# Patient Record
Sex: Female | Born: 1980 | Race: Black or African American | Hispanic: No | Marital: Single | Smoking: Never smoker
Health system: Southern US, Community
[De-identification: ages and names within clinical notes are randomized; demographics above are authoritative.]

## PROBLEM LIST (undated history)

## (undated) DIAGNOSIS — L309 Dermatitis, unspecified: Secondary | ICD-10-CM

## (undated) DIAGNOSIS — R413 Other amnesia: Secondary | ICD-10-CM

## (undated) DIAGNOSIS — K589 Irritable bowel syndrome without diarrhea: Secondary | ICD-10-CM

## (undated) DIAGNOSIS — R5382 Chronic fatigue, unspecified: Secondary | ICD-10-CM

## (undated) DIAGNOSIS — N6029 Fibroadenosis of unspecified breast: Secondary | ICD-10-CM

## (undated) DIAGNOSIS — Z8639 Personal history of other endocrine, nutritional and metabolic disease: Secondary | ICD-10-CM

## (undated) DIAGNOSIS — R251 Tremor, unspecified: Secondary | ICD-10-CM

## (undated) DIAGNOSIS — M2550 Pain in unspecified joint: Secondary | ICD-10-CM

## (undated) DIAGNOSIS — I8390 Asymptomatic varicose veins of unspecified lower extremity: Secondary | ICD-10-CM

## (undated) DIAGNOSIS — F419 Anxiety disorder, unspecified: Secondary | ICD-10-CM

## (undated) DIAGNOSIS — R200 Anesthesia of skin: Secondary | ICD-10-CM

## (undated) DIAGNOSIS — R42 Dizziness and giddiness: Secondary | ICD-10-CM

## (undated) DIAGNOSIS — F32A Depression, unspecified: Secondary | ICD-10-CM

## (undated) DIAGNOSIS — R202 Paresthesia of skin: Secondary | ICD-10-CM

## (undated) DIAGNOSIS — R55 Syncope and collapse: Secondary | ICD-10-CM

## (undated) DIAGNOSIS — F329 Major depressive disorder, single episode, unspecified: Secondary | ICD-10-CM

## (undated) HISTORY — DX: Syncope and collapse: R55

## (undated) HISTORY — DX: Anxiety disorder, unspecified: F41.9

## (undated) HISTORY — DX: Fibroadenosis of unspecified breast: N60.29

## (undated) HISTORY — DX: Dizziness and giddiness: R42

## (undated) HISTORY — DX: Depression, unspecified: F32.A

## (undated) HISTORY — DX: Pain in unspecified joint: M25.50

## (undated) HISTORY — DX: Chronic fatigue, unspecified: R53.82

## (undated) HISTORY — DX: Paresthesia of skin: R20.2

## (undated) HISTORY — DX: Asymptomatic varicose veins of unspecified lower extremity: I83.90

## (undated) HISTORY — DX: Personal history of other endocrine, nutritional and metabolic disease: Z86.39

## (undated) HISTORY — DX: Tremor, unspecified: R25.1

## (undated) HISTORY — DX: Irritable bowel syndrome, unspecified: K58.9

## (undated) HISTORY — DX: Dermatitis, unspecified: L30.9

## (undated) HISTORY — DX: Major depressive disorder, single episode, unspecified: F32.9

## (undated) HISTORY — DX: Anesthesia of skin: R20.0

## (undated) HISTORY — DX: Other amnesia: R41.3

## (undated) HISTORY — PX: TUBAL LIGATION: SHX77

---

## 2008-07-18 ENCOUNTER — Emergency Department (HOSPITAL_COMMUNITY): Admission: EM | Admit: 2008-07-18 | Discharge: 2008-07-18 | Payer: Self-pay | Admitting: Emergency Medicine

## 2009-02-04 ENCOUNTER — Emergency Department (HOSPITAL_COMMUNITY): Admission: EM | Admit: 2009-02-04 | Discharge: 2009-02-04 | Payer: Self-pay | Admitting: Emergency Medicine

## 2009-03-01 ENCOUNTER — Emergency Department (HOSPITAL_COMMUNITY): Admission: EM | Admit: 2009-03-01 | Discharge: 2009-03-01 | Payer: Self-pay | Admitting: Obstetrics and Gynecology

## 2009-04-01 ENCOUNTER — Emergency Department (HOSPITAL_COMMUNITY): Admission: EM | Admit: 2009-04-01 | Discharge: 2009-04-01 | Payer: Self-pay | Admitting: Emergency Medicine

## 2009-04-10 ENCOUNTER — Other Ambulatory Visit: Admission: RE | Admit: 2009-04-10 | Discharge: 2009-04-10 | Payer: Self-pay | Admitting: Obstetrics and Gynecology

## 2009-08-27 ENCOUNTER — Emergency Department (HOSPITAL_COMMUNITY): Admission: EM | Admit: 2009-08-27 | Discharge: 2009-08-27 | Payer: Self-pay | Admitting: Emergency Medicine

## 2009-09-10 ENCOUNTER — Emergency Department (HOSPITAL_COMMUNITY): Admission: EM | Admit: 2009-09-10 | Discharge: 2009-09-10 | Payer: Self-pay | Admitting: Family Medicine

## 2009-09-30 ENCOUNTER — Emergency Department (HOSPITAL_COMMUNITY): Admission: EM | Admit: 2009-09-30 | Discharge: 2009-09-30 | Payer: Self-pay | Admitting: Emergency Medicine

## 2009-10-19 ENCOUNTER — Emergency Department (HOSPITAL_COMMUNITY): Admission: EM | Admit: 2009-10-19 | Discharge: 2009-10-19 | Payer: Self-pay | Admitting: Emergency Medicine

## 2010-01-25 ENCOUNTER — Emergency Department (HOSPITAL_COMMUNITY): Admission: EM | Admit: 2010-01-25 | Discharge: 2010-01-25 | Payer: Self-pay | Admitting: Emergency Medicine

## 2010-08-01 ENCOUNTER — Emergency Department (HOSPITAL_COMMUNITY)
Admission: EM | Admit: 2010-08-01 | Discharge: 2010-08-01 | Payer: Self-pay | Source: Home / Self Care | Admitting: Emergency Medicine

## 2010-10-07 LAB — POCT RAPID STREP A (OFFICE): Streptococcus, Group A Screen (Direct): NEGATIVE

## 2010-10-28 LAB — URINALYSIS, ROUTINE W REFLEX MICROSCOPIC
Bilirubin Urine: NEGATIVE
Glucose, UA: NEGATIVE mg/dL
Ketones, ur: NEGATIVE mg/dL
Nitrite: NEGATIVE

## 2010-10-28 LAB — URINE MICROSCOPIC-ADD ON

## 2010-10-28 LAB — GLUCOSE, CAPILLARY: Glucose-Capillary: 70 mg/dL (ref 70–99)

## 2010-10-28 LAB — POCT I-STAT, CHEM 8
Chloride: 104 mEq/L (ref 96–112)
Glucose, Bld: 82 mg/dL (ref 70–99)
TCO2: 26 mmol/L (ref 0–100)

## 2011-02-23 ENCOUNTER — Emergency Department (HOSPITAL_COMMUNITY)
Admission: EM | Admit: 2011-02-23 | Discharge: 2011-02-23 | Discharge status: Against Medical Advice | Payer: Commercial Managed Care - PPO | Attending: Emergency Medicine | Admitting: Emergency Medicine

## 2011-02-23 DIAGNOSIS — R21 Rash and other nonspecific skin eruption: Secondary | ICD-10-CM | POA: Insufficient documentation

## 2011-03-09 ENCOUNTER — Inpatient Hospital Stay (INDEPENDENT_AMBULATORY_CARE_PROVIDER_SITE_OTHER)
Admission: RE | Admit: 2011-03-09 | Discharge: 2011-03-09 | Disposition: A | Discharge status: Home / Self Care | Payer: 59 | Source: Ambulatory Visit | Attending: Family Medicine | Admitting: Family Medicine

## 2011-03-09 DIAGNOSIS — T6391XA Toxic effect of contact with unspecified venomous animal, accidental (unintentional), initial encounter: Secondary | ICD-10-CM

## 2011-04-26 LAB — RAPID STREP SCREEN (MED CTR MEBANE ONLY): Streptococcus, Group A Screen (Direct): POSITIVE — AB

## 2013-07-23 ENCOUNTER — Emergency Department (HOSPITAL_COMMUNITY): Payer: PRIVATE HEALTH INSURANCE

## 2013-07-23 ENCOUNTER — Emergency Department (HOSPITAL_COMMUNITY)
Admission: EM | Admit: 2013-07-23 | Discharge: 2013-07-24 | Disposition: A | Discharge status: Home / Self Care | Payer: PRIVATE HEALTH INSURANCE | Attending: Emergency Medicine | Admitting: Emergency Medicine

## 2013-07-23 DIAGNOSIS — S92501A Displaced unspecified fracture of right lesser toe(s), initial encounter for closed fracture: Secondary | ICD-10-CM

## 2013-07-23 DIAGNOSIS — S92919A Unspecified fracture of unspecified toe(s), initial encounter for closed fracture: Secondary | ICD-10-CM | POA: Insufficient documentation

## 2013-07-23 DIAGNOSIS — Y9301 Activity, walking, marching and hiking: Secondary | ICD-10-CM | POA: Insufficient documentation

## 2013-07-23 DIAGNOSIS — Y9289 Other specified places as the place of occurrence of the external cause: Secondary | ICD-10-CM | POA: Insufficient documentation

## 2013-07-23 DIAGNOSIS — IMO0002 Reserved for concepts with insufficient information to code with codable children: Secondary | ICD-10-CM | POA: Insufficient documentation

## 2013-07-23 MED ORDER — IBUPROFEN 800 MG PO TABS
800.0000 mg | ORAL_TABLET | Freq: Once | ORAL | Status: AC
  Administered 2013-07-23: 800 mg via ORAL
  Filled 2013-07-23: qty 1

## 2013-07-23 NOTE — ED Provider Notes (Signed)
CSN: [id number]     Arrival date & time 07/23/13  2314 History  This chart was scribed for non-physician practitioner Emilia BeckKaitlyn Eljay Lave, PA-C working with Lyanne CoKevin M Campos, MD by Joaquin MusicKristina Sanchez-Matthews, ED Scribe. This patient was seen in room WTR9/WTR9 and the patient's care was started at 11:36 PM .  Chief Complaint  Patient presents with  . Toe Injury   The history is provided by the patient. No language interpreter was used.   HPI Comments: Courtney Carey is a [age] y.o. female who presents to the Emergency Department complaining of R 5th digit injury that occurred PTA. Pt states she was walking in the kitchen and states she "stubbed her toe" on the corner of a kitchen cabinet. She states she feels she is having numbness to R 5th digit. Pt denies any other injuries.   No past medical history on file. No past surgical history on file. No family history on file. History  Substance Use Topics  . Smoking status: Not on file  . Smokeless tobacco: Not on file  . Alcohol Use: Not on file   OB History   No data available     Review of Systems  All other systems reviewed and are negative.    Allergies  Review of patient's allergies indicates not on file.  Home Medications  No current outpatient prescriptions on file.  BP 132/88  Pulse 83  Temp(Src) 98.8 F (37.1 C) (Oral)  Resp 16  SpO2 100%  Physical Exam  Nursing note and vitals reviewed. Constitutional: She is oriented to person, place, and time. She appears well-developed and well-nourished. No distress.  HENT:  Head: Normocephalic and atraumatic.  Eyes: EOM are normal.  Neck: Neck supple. No tracheal deviation present.  Cardiovascular: Normal rate.   Pulmonary/Chest: Effort normal. No respiratory distress.  Musculoskeletal: Normal range of motion.  5th R toe tenderness to palpitation. No obvious deformity.   Neurological: She is alert and oriented to person, place, and time.  Sensation intact.  Skin: Skin is warm  and dry.  No open wound.  Psychiatric: She has a normal mood and affect. Her behavior is normal.    ED Course  Procedures  DIAGNOSTIC STUDIES: Oxygen Saturation is 100% on Ra, normal by my interpretation.    COORDINATION OF CARE: 11:38 PM-Discussed treatment plan which includes X-Ray of Toe. Pt agreed to plan.   Labs Review Labs Reviewed - No data to display Imaging Review Dg Toe 5th Right  07/24/2013   CLINICAL DATA:  Injured 5th digit.  EXAM: RIGHT FIFTH TOE  COMPARISON:  None.  FINDINGS: There is a small avulsion fracture along the medial aspect of PIP joint. No other significant bony findings.  IMPRESSION: Small avulsion fracture at the PIP joint   Electronically Signed   By: Loralie ChampagneMark  Gallerani M.D.   On: 07/24/2013 00:29    EKG Interpretation   None      MDM   1. Fracture of fifth toe, right, closed, initial encounter      12:50 AM Small avulsion fracture of right fifth toe. Patient will have buddy tape and post op shoe. Patient will have pain medication and ortho follow up as needed. No neurovascular compromise.   I personally performed the services described in this documentation, which was scribed in my presence. The recorded information has been reviewed and is accurate.    Emilia BeckKaitlyn Marino Rogerson, PA-C 07/24/13 [id number]

## 2013-07-23 NOTE — ED Notes (Signed)
Pt states she hit her R little toe on a chair tonight. Pt ambulatory, but painful.

## 2013-07-24 MED ORDER — TRAMADOL HCL 50 MG PO TABS: 50.0000 mg | ORAL_TABLET | Freq: Four times a day (QID) | ORAL | Status: DC | PRN

## 2013-07-24 NOTE — Discharge Instructions (Signed)
Wear post op shoe until follow up. Take Tramadol as needed for pain. Refer to attached documents for more information.

## 2013-07-24 NOTE — ED Provider Notes (Signed)
Medical screening examination/treatment/procedure(s) were performed by non-physician practitioner and as supervising physician I was immediately available for consultation/collaboration.  EKG Interpretation   None         Mendi Constable M Meridee Branum, MD 07/24/13 0459 

## 2014-01-06 ENCOUNTER — Encounter: Payer: Self-pay | Admitting: Family Medicine

## 2014-01-06 ENCOUNTER — Ambulatory Visit (INDEPENDENT_AMBULATORY_CARE_PROVIDER_SITE_OTHER): Payer: PRIVATE HEALTH INSURANCE | Admitting: Family Medicine

## 2014-01-06 ENCOUNTER — Encounter (INDEPENDENT_AMBULATORY_CARE_PROVIDER_SITE_OTHER): Payer: Self-pay

## 2014-01-06 ENCOUNTER — Ambulatory Visit (HOSPITAL_BASED_OUTPATIENT_CLINIC_OR_DEPARTMENT_OTHER)
Admission: RE | Admit: 2014-01-06 | Discharge: 2014-01-06 | Disposition: A | Discharge status: Home / Self Care | Payer: PRIVATE HEALTH INSURANCE | Source: Ambulatory Visit | Attending: Family Medicine | Admitting: Family Medicine

## 2014-01-06 VITALS — BP 123/85 | HR 68 | Ht 66.0 in | Wt 187.0 lb

## 2014-01-06 DIAGNOSIS — S6992XA Unspecified injury of left wrist, hand and finger(s), initial encounter: Secondary | ICD-10-CM

## 2014-01-06 DIAGNOSIS — S6990XA Unspecified injury of unspecified wrist, hand and finger(s), initial encounter: Principal | ICD-10-CM | POA: Insufficient documentation

## 2014-01-06 DIAGNOSIS — Y929 Unspecified place or not applicable: Secondary | ICD-10-CM | POA: Insufficient documentation

## 2014-01-06 DIAGNOSIS — X58XXXA Exposure to other specified factors, initial encounter: Secondary | ICD-10-CM | POA: Insufficient documentation

## 2014-01-06 DIAGNOSIS — S6980XA Other specified injuries of unspecified wrist, hand and finger(s), initial encounter: Secondary | ICD-10-CM | POA: Insufficient documentation

## 2014-01-06 NOTE — Progress Notes (Signed)
Patient ID: Courtney Carey, female   DOB: Aug 16, 1980, 33 y.o.   MRN: [id number]  PCP: Jamal CollinHEDGECOCK,SUZANNE, PA-C  Subjective:   HPI: Patient is a 33 y.o. female here for left finger injury.  Patient reports on 6/12 she accidentally slammed her car door onto left index finger. Pain, swelling middle phalanx of this digit. Dull constant ache also. Able to flex and extend the digit. Bought an extension splint to use. Is right handed. No prior injuries.  History reviewed. No pertinent past medical history.  Current Outpatient Prescriptions on File Prior to Visit  Medication Sig Dispense Refill  . traMADol (ULTRAM) 50 MG tablet Take 1 tablet (50 mg total) by mouth every 6 (six) hours as needed.  15 tablet  0  . Vitamin D, Ergocalciferol, (DRISDOL) 50000 UNITS CAPS capsule Take 50,000 Units by mouth every 7 (seven) days.       No current facility-administered medications on file prior to visit.    History reviewed. No pertinent past surgical history.  Allergies  Allergen Reactions  . Tape Other (See Comments)    Burns skin  . Ciprofloxacin Itching and Rash    History   Social History  . Marital Status: Single    Spouse Name: N/A    Number of Children: N/A  . Years of Education: N/A   Occupational History  . Not on file.   Social History Main Topics  . Smoking status: Never Smoker   . Smokeless tobacco: Not on file  . Alcohol Use: Not on file  . Drug Use: Not on file  . Sexual Activity: Not on file   Other Topics Concern  . Not on file   Social History Narrative  . No narrative on file    No family history on file.  BP 123/85  Pulse 68  Ht 5\' 6"  (1.676 m)  Wt 187 lb (84.823 kg)  BMI 30.20 kg/m2  LMP 12/11/2013  Review of Systems: See HPI above.    Objective:  Physical Exam:  Gen: NAD  Left hand: No gross deformity, swelling, bruising, malrotation, angulation. TTP middle phalanx of index finger.  No other tenderness. FROM MCP, PIP, DIP index finger  with 5/5 strength on resistance flexion and extension of these digits. Collateral ligaments intact. Some decreased sensation radial side of digit but otherwise NVI distally    Assessment & Plan:  1. Left index finger contusion - radiographs negative.  Ultrasound also normal.  Reassured.  Buddy taping or splint only for comfort.  Tylenol, ibuprofen, icing as needed for pain.  F/u prn.

## 2014-01-06 NOTE — Patient Instructions (Signed)
You have a finger contusion. Your x-rays and ultrasound are reassuring. Ice the area 15 minutes at a time 3-4 times a day as needed. Tylenol/ibuprofen as needed for pain. Buddy tape or use your splint as needed. I expect this to take 2-3 weeks to completely improve though the numbness can take longer. Follow up as needed.

## 2014-01-06 NOTE — Assessment & Plan Note (Signed)
Left index finger contusion - radiographs negative.  Ultrasound also normal.  Reassured.  Buddy taping or splint only for comfort.  Tylenol, ibuprofen, icing as needed for pain.  F/u prn.

## 2014-05-30 ENCOUNTER — Telehealth: Payer: Self-pay | Admitting: Neurology

## 2014-05-30 NOTE — Telephone Encounter (Signed)
There was some confusion regarding pt's initial appt. Apparently original paperwork that came from the referring office came through with the patient's last name appearing to be Vernard GamblesBuller instead of PasatiempoButler. Looking for the last name Vernard GamblesBuller, obviously we could not locate this patient in [employer]. A duplicate account was set up. When pt called to confirm her appointment we searched the last name Charm BargesButler. The appt was not attached to the StollingsButler account, it was linked to the inccorrect/duplicate account of Buller.   Pt advised she did not want to come to our office given the issues we had with her appt.  I researched what went on, entered a request in [employer] to delete the duplicate acct and called the pt to explain what happened and to offer our apologies for the confusion and error. Advised appt was still on the schedule. Pt will plan on keeping appt as scheduled. I have made a note in the appt details for our office to give the pt a [employer] gift card for service recovery / Sherri S.

## 2014-07-25 ENCOUNTER — Telehealth: Payer: Self-pay | Admitting: Neurology

## 2014-07-25 NOTE — Telephone Encounter (Signed)
Pt resch appt from 07-26-14 to 09-12-14

## 2014-07-26 ENCOUNTER — Ambulatory Visit: Payer: Self-pay | Admitting: Neurology

## 2014-07-26 ENCOUNTER — Ambulatory Visit: Payer: PRIVATE HEALTH INSURANCE | Admitting: Neurology

## 2014-09-12 ENCOUNTER — Ambulatory Visit: Payer: PRIVATE HEALTH INSURANCE | Admitting: Neurology

## 2014-09-14 ENCOUNTER — Telehealth: Payer: Self-pay | Admitting: Neurology

## 2014-09-14 NOTE — Telephone Encounter (Signed)
Per Dr. Allena KatzPatel, do not r/s NP appt with [facility] neuro due to repeated cancellations and no show. Referring provider notified of 09/12/14 no show by fax and has been made aware that we can not r/s / Courtney S.

## 2014-09-19 ENCOUNTER — Telehealth: Payer: Self-pay | Admitting: Neurology

## 2014-09-19 NOTE — Telephone Encounter (Signed)
Pt no showed NP appt 09/12/14 w/ Dr. Allena KatzPatel. Per Dr. Allena KatzPatel, we will not r/s this no showed NP appt. See 09/14/14 documentation.   No show letter mailed to pt / Sherri S>

## 2014-09-23 ENCOUNTER — Encounter: Payer: Self-pay | Admitting: *Deleted

## 2016-02-17 ENCOUNTER — Emergency Department (HOSPITAL_COMMUNITY): Payer: PRIVATE HEALTH INSURANCE

## 2016-02-17 ENCOUNTER — Emergency Department (HOSPITAL_COMMUNITY)
Admission: EM | Admit: 2016-02-17 | Discharge: 2016-02-18 | Disposition: A | Discharge status: Home / Self Care | Payer: PRIVATE HEALTH INSURANCE | Attending: Emergency Medicine | Admitting: Emergency Medicine

## 2016-02-17 DIAGNOSIS — R404 Transient alteration of awareness: Secondary | ICD-10-CM | POA: Diagnosis not present

## 2016-02-17 DIAGNOSIS — R4182 Altered mental status, unspecified: Secondary | ICD-10-CM | POA: Diagnosis present

## 2016-02-17 DIAGNOSIS — Z79899 Other long term (current) drug therapy: Secondary | ICD-10-CM | POA: Insufficient documentation

## 2016-02-17 LAB — CBC
HCT: 38.8 % (ref 36.0–46.0)
HEMOGLOBIN: 13.2 g/dL (ref 12.0–15.0)
MCH: 30 pg (ref 26.0–34.0)
MCHC: 34 g/dL (ref 30.0–36.0)
MCV: 88.2 fL (ref 78.0–100.0)
Platelets: 167 10*3/uL (ref 150–400)
RBC: 4.4 MIL/uL (ref 3.87–5.11)
RDW: 12.3 % (ref 11.5–15.5)
WBC: 6.9 10*3/uL (ref 4.0–10.5)

## 2016-02-17 LAB — COMPREHENSIVE METABOLIC PANEL
ALBUMIN: 4.4 g/dL (ref 3.5–5.0)
ALK PHOS: 62 U/L (ref 38–126)
ALT: 16 U/L (ref 14–54)
AST: 23 U/L (ref 15–41)
Anion gap: 8 (ref 5–15)
BILIRUBIN TOTAL: 0.9 mg/dL (ref 0.3–1.2)
BUN: 8 mg/dL (ref 6–20)
CALCIUM: 9 mg/dL (ref 8.9–10.3)
CO2: 23 mmol/L (ref 22–32)
Chloride: 106 mmol/L (ref 101–111)
Creatinine, Ser: 0.9 mg/dL (ref 0.44–1.00)
GFR calc Af Amer: >60 mL/min (ref >60)
GLUCOSE: 75 mg/dL (ref 65–99)
Potassium: 3.6 mmol/L (ref 3.5–5.1)
Sodium: 137 mmol/L (ref 135–145)
Total Protein: 8.2 g/dL — ABNORMAL HIGH (ref 6.5–8.1)

## 2016-02-17 LAB — RAPID URINE DRUG SCREEN, HOSP PERFORMED
Amphetamines: NOT DETECTED
BARBITURATES: NOT DETECTED
Benzodiazepines: NOT DETECTED
COCAINE: NOT DETECTED
Opiates: NOT DETECTED
TETRAHYDROCANNABINOL: NOT DETECTED

## 2016-02-17 LAB — CBG MONITORING, ED
GLUCOSE-CAPILLARY: 65 mg/dL (ref 65–99)
Glucose-Capillary: 71 mg/dL (ref 65–99)

## 2016-02-17 MED ORDER — SODIUM CHLORIDE 0.9 % IV BOLUS (SEPSIS)
1000.0000 mL | Freq: Once | INTRAVENOUS | Status: AC
  Administered 2016-02-17: 1000 mL via INTRAVENOUS

## 2016-02-17 NOTE — ED Notes (Signed)
Patient transported to CT 

## 2016-02-17 NOTE — ED Triage Notes (Signed)
She is brought in with an accompanying neighbor, with c/o "unresponsive".  She is tearful and is not answering questions at this time, although her eyes are open and her pupils are equal and react to light.  Also, her v.s. Are normal. She does nod appropriately when instructed to do so by her neighbor.

## 2016-02-17 NOTE — ED Provider Notes (Signed)
[facility]-EMERGENCY DEPT Provider Note   CSN: [id number] Arrival date & time: 02/17/16  1854    History   Chief Complaint Chief Complaint  Patient presents with  . Altered Mental Status    HPI Courtney Carey is a 35 y.o. female.  HPI  Patient presents after an unusual episode, now with left-sided chest pain, and left arm weakness. Patient acknowledges a history of reactive hypoglycemia, depression, and is currently taking additional medication for weight loss. However, she states that she has been hurting since health. Today, about 3 hours ago she had an episode of decreased interactivity, and per report, the patient's son, she was sitting still, essentially motionless, nonverbal for several minutes. She states that she recalls that event felt as though she could not express herself. Currently she denies any confusion, disorientation, lightheadedness, nausea, vomiting. She does describe pain throughout the left upper chest, left arm, left shoulder, as well as weakness throughout the left arm. She denies vision changes as well. Patient denies family history of substantial medical illness, though she does have one family member with coronary disease.   No past medical history on file.  Patient Active Problem List   Diagnosis Date Noted  . Injury of index finger 01/06/2014    No past surgical history on file.  OB History    No data available       Home Medications    Prior to Admission medications   Medication Sig Start Date End Date Taking? Authorizing Provider  buPROPion (WELLBUTRIN XL) 150 MG 24 hr tablet Take 150 mg by mouth daily.   Yes Historical Provider, MD  pantoprazole (PROTONIX) 20 MG tablet Take 20 mg by mouth daily.   Yes Historical Provider, MD  phentermine (ADIPEX-P) 37.5 MG tablet Take 18.75 mg by mouth daily before breakfast.   Yes Historical Provider, MD  topiramate (TOPAMAX) 50 MG tablet Take 25 mg by mouth daily at 12 noon.   Yes Historical Provider,  MD  traMADol (ULTRAM) 50 MG tablet Take 50 mg by mouth every 8 (eight) hours as needed for moderate pain.   Yes Historical Provider, MD    Family History No family history on file.  Social History Social History  Substance Use Topics  . Smoking status: Never Smoker  . Smokeless tobacco: Not on file  . Alcohol use Not on file     Allergies   Tape and Ciprofloxacin   Review of Systems Review of Systems  Constitutional:       Per HPI, otherwise negative  HENT:       Per HPI, otherwise negative  Respiratory:       Per HPI, otherwise negative  Cardiovascular:       Per HPI, otherwise negative  Gastrointestinal: Negative for vomiting.  Endocrine:       Negative aside from HPI  Genitourinary:       Neg aside from HPI   Musculoskeletal:       Per HPI, otherwise negative  Skin: Negative.   Neurological: Positive for weakness. Negative for syncope.     Physical Exam Updated Vital Signs There were no vitals taken for this visit.  Physical Exam  Constitutional: She is oriented to person, place, and time. She appears well-developed and well-nourished. No distress.  HENT:  Head: Normocephalic and atraumatic.  Eyes: Conjunctivae and EOM are normal.  Cardiovascular: Normal rate and regular rhythm.   Pulmonary/Chest: Effort normal and breath sounds normal. No stridor. No respiratory distress.  Abdominal: She exhibits no  distension.  Musculoskeletal: She exhibits no edema.  Neurological: She is alert and oriented to person, place, and time. No cranial nerve deficit.  Diminished gross strength, proximally in the left upper and lower extremities, though this is possibly exertional Otherwise unremarkable neurologic exam, with no facial asymmetry, no speech deficit.  Skin: Skin is warm and dry.  Psychiatric: She has a normal mood and affect.  Nursing note and vitals reviewed.    ED Treatments / Results  Labs (all labs ordered are listed, but only abnormal results are  displayed) Labs Reviewed  COMPREHENSIVE METABOLIC PANEL - Abnormal; Notable for the following:       Result Value   Total Protein 8.2 (*)    All other components within normal limits  URINE RAPID DRUG SCREEN, HOSP PERFORMED  CBC  CBG MONITORING, ED  CBG MONITORING, ED  CBG MONITORING, ED    EKG  EKG Interpretation  Date/Time:  Saturday February 17 2016 20:24:16 EDT Ventricular Rate:  75 PR Interval:    QRS Duration: 86 QT Interval:  361 QTC Calculation: 404 R Axis:   61 Text Interpretation:  Sinus rhythm T wave abnormality Abnormal ekg Confirmed by Gerhard Munch  MD ([id number]) on 02/17/2016 9:17:33 PM       Radiology Ct Head Wo Contrast  Result Date: 02/17/2016 CLINICAL DATA:  Initial evaluation for acute left-sided weakness. EXAM: CT HEAD WITHOUT CONTRAST TECHNIQUE: Contiguous axial images were obtained from the base of the skull through the vertex without intravenous contrast. COMPARISON:  None. FINDINGS: There is no acute intracranial hemorrhage or infarct. No mass lesion or midline shift. Gray-white matter differentiation is well maintained. Ventricles are normal in size without evidence of hydrocephalus. CSF containing spaces are within normal limits. No extra-axial fluid collection. The calvarium is intact. Orbital soft tissues are within normal limits. The paranasal sinuses and mastoid air cells are well pneumatized and free of fluid. Scalp soft tissues are unremarkable. IMPRESSION: Normal head CT.  No acute intracranial process identified. Electronically Signed   By: Rise Mu M.D.   On: 02/17/2016 22:37   Procedures Procedures (including critical care time)  Medications Ordered in ED Medications  sodium chloride 0.9 % bolus 1,000 mL (0 mLs Intravenous Stopped 02/17/16 2338)     Initial Impression / Assessment and Plan / ED Course  I have reviewed the triage vital signs and the nursing notes.  Pertinent labs & imaging results that were available during my  care of the patient were reviewed by me and considered in my medical decision making (see chart for details).  Clinical Course    12:37 AM Patient awake, alert, in NAD.  We discussed all findings.Patient will follow-up with primary care, neurology  Final Clinical Impressions(s) / ED Diagnoses  Patient presents after an unusual episode of decreased interactivity. On my evaluation the patient has returned to baseline interactivity, and does only complain of some chest discomfort, left-sided dysesthesia. Objectively, the patient is essentially neurologically intact, in no distress, is hemodynamically stable. Given the patient's history, there is some suspicion for brief conversion reaction, though the patient's acknowledgment of labile glucose is possibly contributory. Here, for several hours of monitoring, she had no similar episodes, no evidence of neurologic dysfunction, no decompensation. With reassuring findings, after discussion with her and family members, she was discharged in stable condition to continue evaluation as an outpatient.   Gerhard Munch, MD 02/18/16 [phone]

## 2016-02-17 NOTE — ED Notes (Signed)
I attempted to recollect lab and was unsuccessful

## 2016-02-17 NOTE — ED Notes (Signed)
Patient assisted to restroom.  

## 2016-02-17 NOTE — ED Notes (Signed)
Patient ambulatory to restroom  ?

## 2016-02-17 NOTE — ED Notes (Signed)
Patient returned to room and put back on monitor.

## 2016-02-17 NOTE — ED Notes (Signed)
Attempted IV x2.  Asked Ilene, RN to attempt.

## 2016-02-18 NOTE — ED Notes (Signed)
Patient d/c;d self care.  F/U reviewed with patient and family.  Patient verbalized understanding. 

## 2016-02-18 NOTE — Discharge Instructions (Signed)
As discussed, today's evaluation has been largely reassuring. However, with your episode of change in cognition, and your low blood sugar it is important that you follow-up with a new primary care physician, as well as a neurologist.  Return here for any concerning changes in your condition.

## 2016-11-25 IMAGING — CT CT HEAD W/O CM
3 of 4 series · 14 of 47 positions shown, 16 images · non-contrast
Comparison: None.

CLINICAL DATA: Initial evaluation for acute left-sided weakness.

EXAM:
CT HEAD WITHOUT CONTRAST
TECHNIQUE: Contiguous axial images were obtained from the base of the skull
through the vertex without intravenous contrast.

[Series 2: head w/o · axial · non-contrast · 0.43mm/px · z∈[-154,-34]mm · 8 of 29 slices shown, 10 images]
[im 3/29  brain]
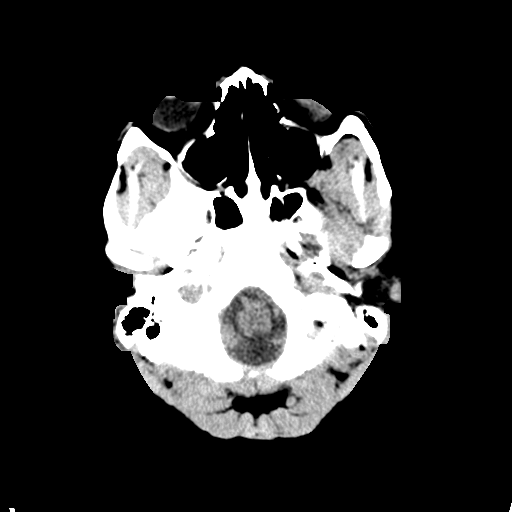
[im 3/29  bone]
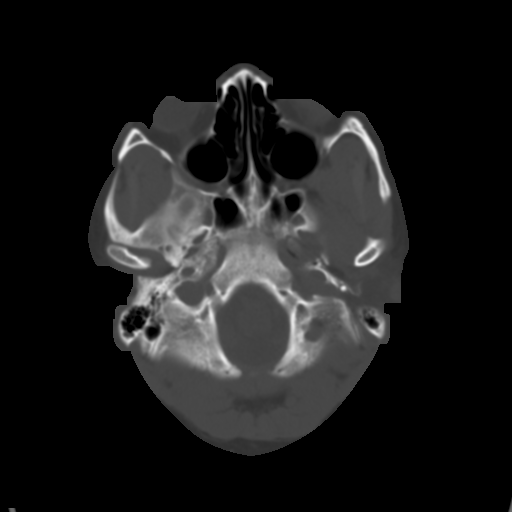
[im 7/29  brain]
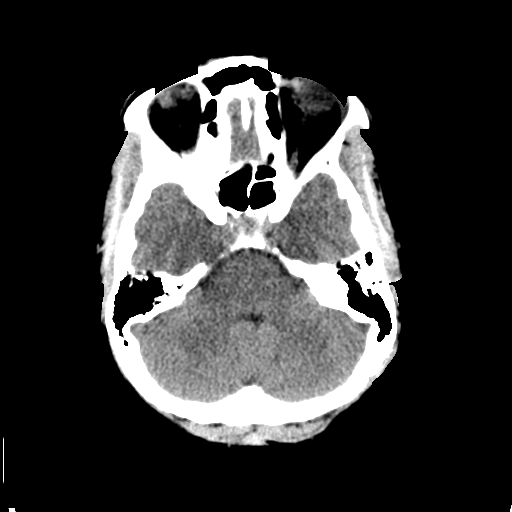
[im 11/29  brain]
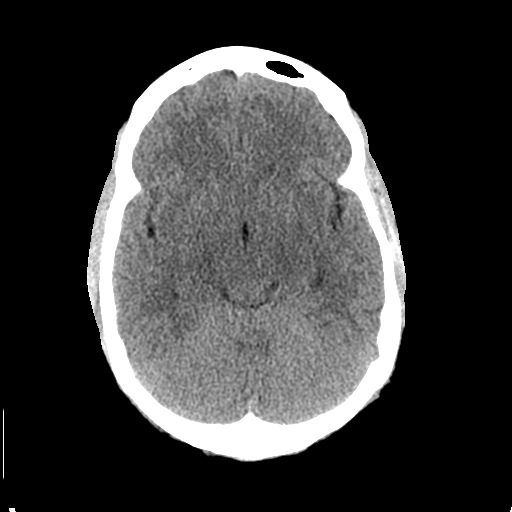
[im 13/29  brain]
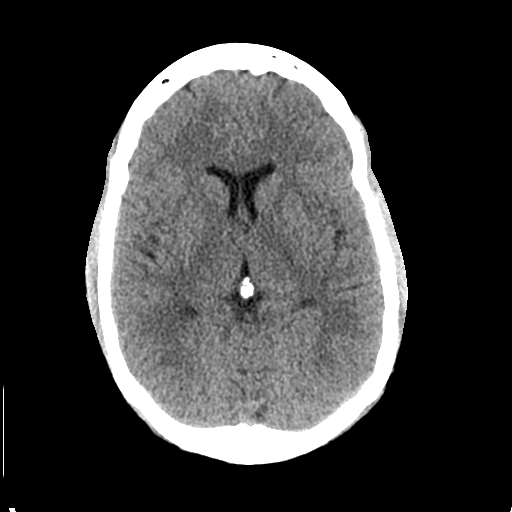
[im 17/29  brain]
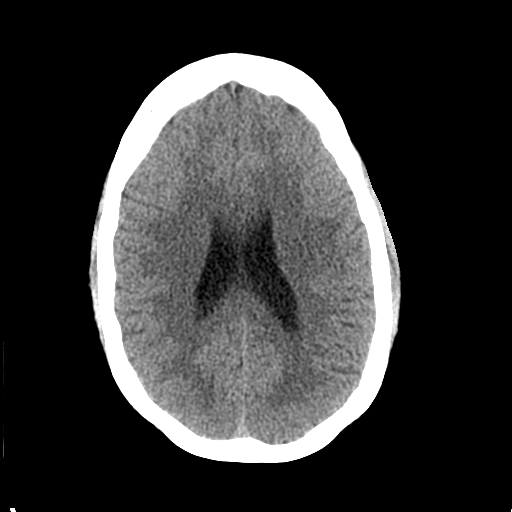
[im 17/29  bone]
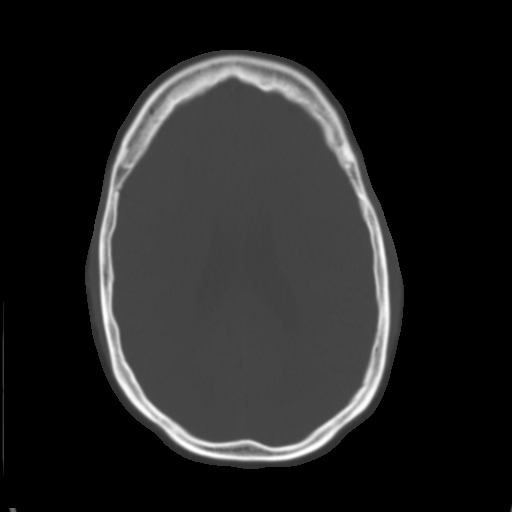
[im 19/29  brain]
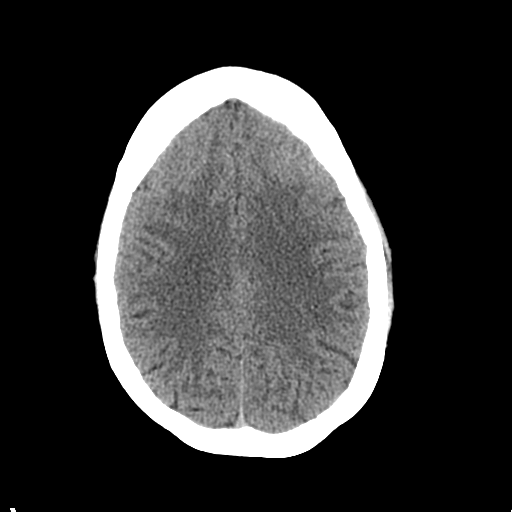
[im 23/29  brain]
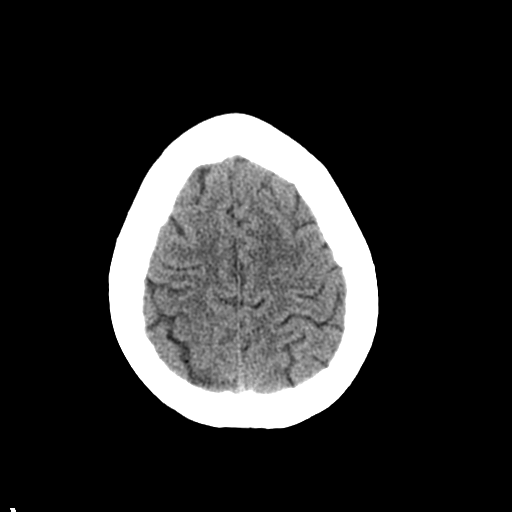
[im 27/29  brain]
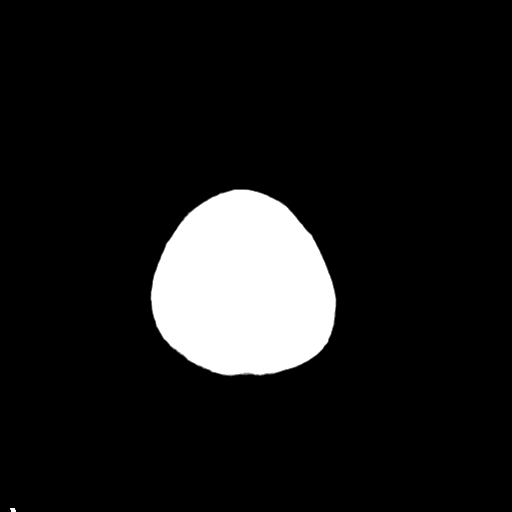

[Series 4: coronal · coronal · 0.28mm/px · 3 of 77 slices shown]
[im 26/77  brain]
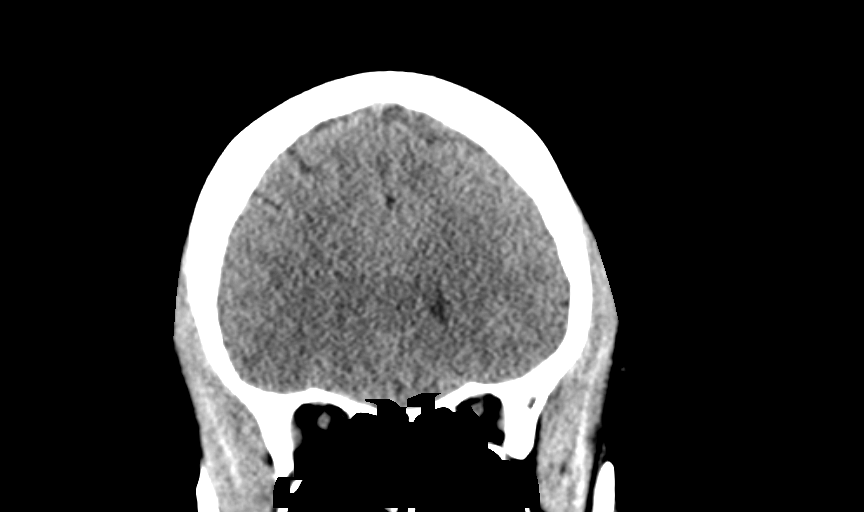
[im 34/77  brain]
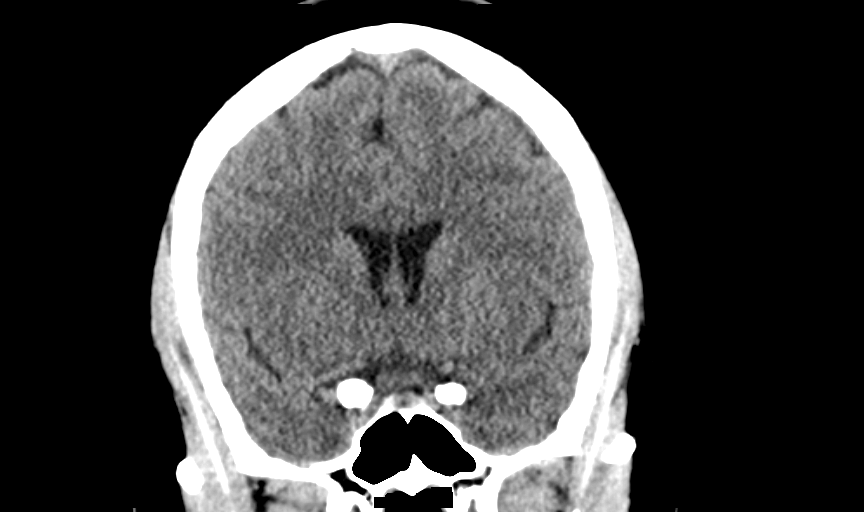
[im 43/77  brain]
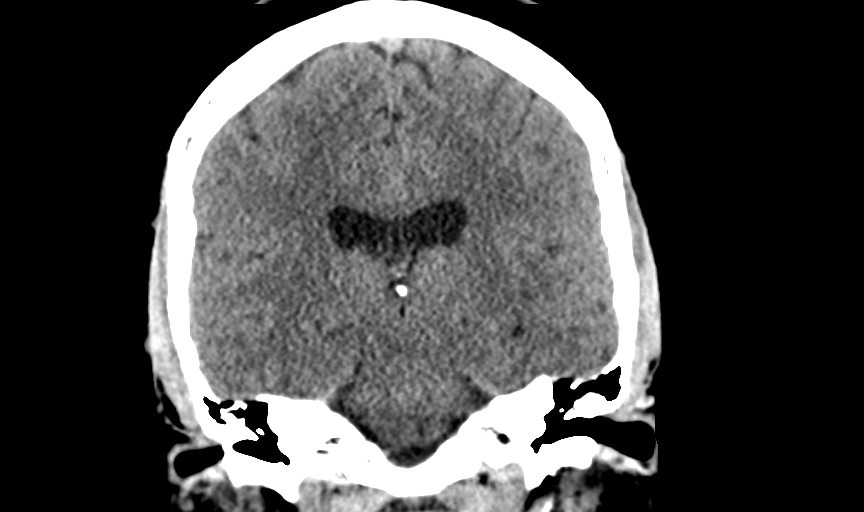

[Series 5: sagittal · sagittal · 0.28mm/px · 3 of 77 slices shown]
[im 26/77  brain]
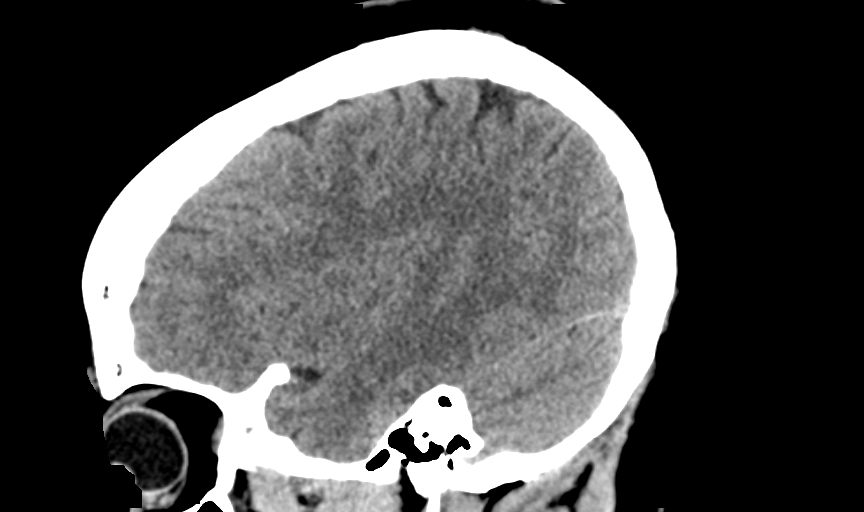
[im 39/77  brain]
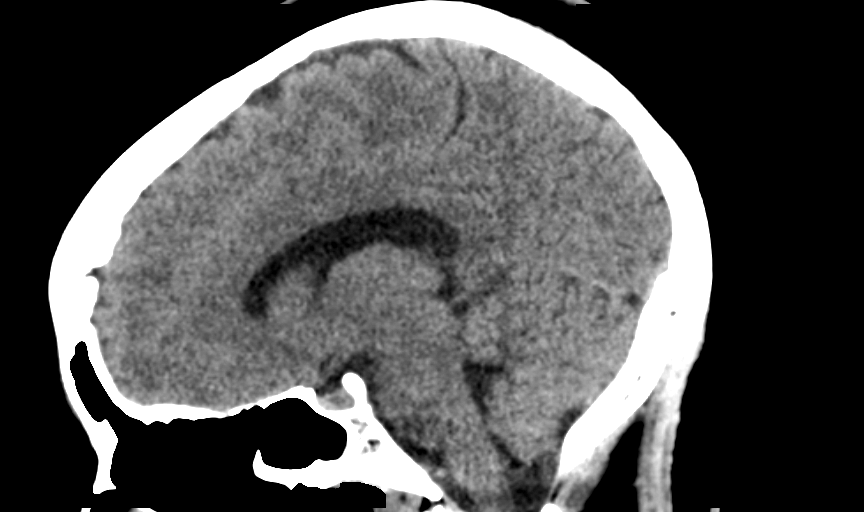
[im 51/77  brain]
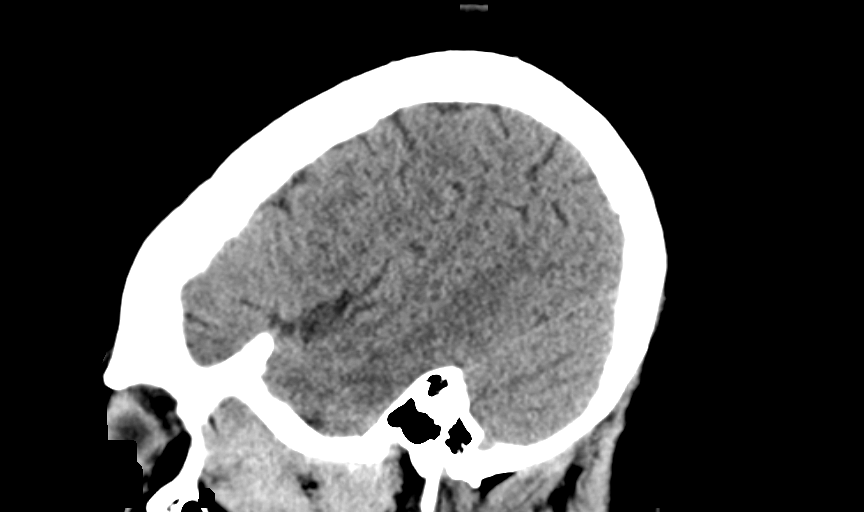

[14 of 47 positions shown; findings below may reference images not displayed]

FINDINGS: There is no acute intracranial hemorrhage or infarct. No mass lesion
or midline shift. Gray-white matter differentiation is well
maintained. Ventricles are normal in size without evidence of
hydrocephalus. CSF containing spaces are within normal limits. No
extra-axial fluid collection.

The calvarium is intact.

Orbital soft tissues are within normal limits.

The paranasal sinuses and mastoid air cells are well pneumatized and
free of fluid.

Scalp soft tissues are unremarkable.
IMPRESSION: Normal head CT.  No acute intracranial process identified.

## 2017-12-18 ENCOUNTER — Encounter: Payer: Self-pay | Admitting: Gastroenterology

## 2018-02-19 ENCOUNTER — Ambulatory Visit: Payer: PRIVATE HEALTH INSURANCE | Admitting: Gastroenterology

## 2018-06-13 ENCOUNTER — Emergency Department (HOSPITAL_COMMUNITY): Payer: PRIVATE HEALTH INSURANCE

## 2018-06-13 ENCOUNTER — Encounter (HOSPITAL_COMMUNITY): Payer: Self-pay

## 2018-06-13 ENCOUNTER — Other Ambulatory Visit: Payer: Self-pay

## 2018-06-13 ENCOUNTER — Emergency Department (HOSPITAL_COMMUNITY)
Admission: EM | Admit: 2018-06-13 | Discharge: 2018-06-13 | Disposition: A | Discharge status: Home / Self Care | Payer: PRIVATE HEALTH INSURANCE | Attending: Emergency Medicine | Admitting: Emergency Medicine

## 2018-06-13 DIAGNOSIS — R0789 Other chest pain: Secondary | ICD-10-CM | POA: Diagnosis not present

## 2018-06-13 DIAGNOSIS — F419 Anxiety disorder, unspecified: Secondary | ICD-10-CM | POA: Insufficient documentation

## 2018-06-13 DIAGNOSIS — F329 Major depressive disorder, single episode, unspecified: Secondary | ICD-10-CM | POA: Insufficient documentation

## 2018-06-13 DIAGNOSIS — R0602 Shortness of breath: Secondary | ICD-10-CM | POA: Diagnosis not present

## 2018-06-13 DIAGNOSIS — E162 Hypoglycemia, unspecified: Secondary | ICD-10-CM | POA: Insufficient documentation

## 2018-06-13 DIAGNOSIS — Z79899 Other long term (current) drug therapy: Secondary | ICD-10-CM | POA: Diagnosis not present

## 2018-06-13 DIAGNOSIS — R079 Chest pain, unspecified: Secondary | ICD-10-CM | POA: Diagnosis present

## 2018-06-13 LAB — COMPREHENSIVE METABOLIC PANEL
ALK PHOS: 60 U/L (ref 38–126)
ALT: 22 U/L (ref 0–44)
ANION GAP: 8 (ref 5–15)
AST: 23 U/L (ref 15–41)
Albumin: 3.6 g/dL (ref 3.5–5.0)
BILIRUBIN TOTAL: 0.9 mg/dL (ref 0.3–1.2)
BUN: 8 mg/dL (ref 6–20)
CALCIUM: 9 mg/dL (ref 8.9–10.3)
CO2: 25 mmol/L (ref 22–32)
Chloride: 106 mmol/L (ref 98–111)
Creatinine, Ser: 0.97 mg/dL (ref 0.44–1.00)
GFR calc Af Amer: >60 mL/min (ref >60)
GLUCOSE: 92 mg/dL (ref 70–99)
Potassium: 3.6 mmol/L (ref 3.5–5.1)
SODIUM: 139 mmol/L (ref 135–145)
TOTAL PROTEIN: 7.3 g/dL (ref 6.5–8.1)

## 2018-06-13 LAB — CBC WITH DIFFERENTIAL/PLATELET
Abs Immature Granulocytes: 0.01 10*3/uL (ref 0.00–0.07)
BASOS PCT: 1 %
Basophils Absolute: 0 10*3/uL (ref 0.0–0.1)
EOS ABS: 0.1 10*3/uL (ref 0.0–0.5)
EOS PCT: 2 %
HCT: 40.7 % (ref 36.0–46.0)
Hemoglobin: 12.9 g/dL (ref 12.0–15.0)
IMMATURE GRANULOCYTES: 0 %
Lymphocytes Relative: 41 %
Lymphs Abs: 2.4 10*3/uL (ref 0.7–4.0)
MCH: 28.8 pg (ref 26.0–34.0)
MCHC: 31.7 g/dL (ref 30.0–36.0)
MCV: 90.8 fL (ref 80.0–100.0)
MONOS PCT: 7 %
Monocytes Absolute: 0.4 10*3/uL (ref 0.1–1.0)
NEUTROS PCT: 49 %
NRBC: 0 % (ref 0.0–0.2)
Neutro Abs: 2.8 10*3/uL (ref 1.7–7.7)
PLATELETS: 179 10*3/uL (ref 150–400)
RBC: 4.48 MIL/uL (ref 3.87–5.11)
RDW: 12.2 % (ref 11.5–15.5)
WBC: 5.8 10*3/uL (ref 4.0–10.5)

## 2018-06-13 LAB — CBG MONITORING, ED
GLUCOSE-CAPILLARY: 59 mg/dL — AB (ref 70–99)
Glucose-Capillary: 78 mg/dL (ref 70–99)
Glucose-Capillary: 102 mg/dL — ABNORMAL HIGH (ref 70–99)

## 2018-06-13 LAB — URINALYSIS, ROUTINE W REFLEX MICROSCOPIC
BILIRUBIN URINE: NEGATIVE
Glucose, UA: NEGATIVE mg/dL
HGB URINE DIPSTICK: NEGATIVE
KETONES UR: NEGATIVE mg/dL
Leukocytes, UA: NEGATIVE
Nitrite: NEGATIVE
PH: 6 (ref 5.0–8.0)
PROTEIN: NEGATIVE mg/dL
Specific Gravity, Urine: 1.004 — ABNORMAL LOW (ref 1.005–1.030)

## 2018-06-13 LAB — I-STAT BETA HCG BLOOD, ED (MC, WL, AP ONLY): I-stat hCG, quantitative: <5 m[IU]/mL (ref <5)

## 2018-06-13 LAB — LIPASE, BLOOD: LIPASE: 30 U/L (ref 11–51)

## 2018-06-13 MED ORDER — PANTOPRAZOLE SODIUM 20 MG PO TBEC: 20.0000 mg | DELAYED_RELEASE_TABLET | Freq: Every day | ORAL | 0 refills | Status: AC

## 2018-06-13 NOTE — ED Provider Notes (Signed)
[facility] EMERGENCY DEPARTMENT Provider Note   CSN: [id number] Arrival date & time: 06/13/18  1356     History   Chief Complaint Chief Complaint  Patient presents with  . Chest Pain  . Shortness of Breath    HPI Courtney Carey is a 37 y.o. female with past medical history of hypoglycemia being evaluated by endocrinology, IBS, chronic fatigue, who presents today for evaluation of chest pain, shortness of breath, and hypoglycemia.  She reports that last night she was not doing anything abnormal when she started developing chest pain and shortness of breath.  Her chest pain is in the left-sided chest does not radiate or move.  She does not have a history of asthma.  No cough, congestion, or other symptoms.  No nausea vomiting diarrhea.    Review shows that she follows with endocrinology Dr. Kathreen Cosier at [facility], last visit 05/28/18.  She denies any personal history of blood clots.  No family history of clotting or bleeding disorders.  She has not had any surgeries or immobilizations in the past 4 weeks.  She denies any leg swelling or hemoptysis.  She does not use any hormones.    HPI  Past Medical History:  Diagnosis Date  . Anxiety   . Arthralgia   . Chronic fatigue   . Depression   . Dermatitis   . Fibroadenosis of breast   . History of hypoglycemia   . History of vitamin D deficiency   . IBS (irritable bowel syndrome)   . Memory loss   . Numbness and tingling in both hands   . Postural dizziness with presyncope   . Tremor   . Varicose vein of leg     Patient Active Problem List   Diagnosis Date Noted  . Injury of index finger 01/06/2014    Past Surgical History:  Procedure Laterality Date  . TUBAL LIGATION       OB History   None      Home Medications    Prior to Admission medications   Medication Sig Start Date End Date Taking? Authorizing Provider  buPROPion (WELLBUTRIN XL) 150 MG 24 hr tablet Take 150 mg by mouth daily.     [provider]  pantoprazole (PROTONIX) 20 MG tablet Take 20 mg by mouth daily.    [provider]  phentermine (ADIPEX-P) 37.5 MG tablet Take 18.75 mg by mouth daily before breakfast.    [provider]  topiramate (TOPAMAX) 50 MG tablet Take 25 mg by mouth daily at 12 noon.    [provider]  traMADol (ULTRAM) 50 MG tablet Take 50 mg by mouth every 8 (eight) hours as needed for moderate pain.    [provider]    Family History Family History  Problem Relation Age of Onset  . Hypertension Mother   . Diabetes Mother   . Rheum arthritis Mother   . Lupus Mother   . Hypertension Father   . Diabetes Father   . Heart disease Father   . Rheum arthritis Father   . Breast cancer Maternal Grandmother   . Cervical cancer Maternal Grandmother   . Breast cancer Paternal Grandmother   . Breast cancer Maternal Aunt     Social History Social History   Tobacco Use  . Smoking status: Never Smoker  . Smokeless tobacco: Never Used  Substance Use Topics  . Alcohol use: Never    Frequency: Never  . Drug use: Never  Allergies   Tape and Ciprofloxacin   Review of Systems Review of Systems  Constitutional: Negative for chills and fever.  Respiratory: Positive for chest tightness and shortness of breath. Negative for cough.   Cardiovascular: Positive for chest pain. Negative for palpitations and leg swelling.  Gastrointestinal: Negative for abdominal pain, diarrhea, nausea and vomiting.  Neurological: Negative for weakness and numbness.  All other systems reviewed and are negative.    Physical Exam Updated Vital Signs BP 119/74   Pulse 78   Temp 98.4 F (36.9 C) (Oral)   Resp 16   Ht 5\' 5"  (1.651 m)   Wt 93.4 kg   LMP 06/06/2018   SpO2 98%   BMI 34.28 kg/m   Physical Exam  Constitutional: She is oriented to person, place, and time. She appears well-developed and well-nourished.  Non-toxic appearance. No distress.  HENT:    Head: Normocephalic and atraumatic.  Eyes: Conjunctivae are normal.  Neck: Normal range of motion. Neck supple. No JVD present. No tracheal deviation present.  Cardiovascular: Normal rate, regular rhythm and normal pulses.  No murmur heard. Pulses:      Dorsalis pedis pulses are 2+ on the right side, and 2+ on the left side.       Posterior tibial pulses are 2+ on the right side, and 2+ on the left side.  Pulmonary/Chest: Effort normal and breath sounds normal. No respiratory distress. She has no decreased breath sounds. She has no wheezes. She has no rhonchi. She has no rales.  Abdominal: Soft. Bowel sounds are normal. She exhibits no distension and no mass. There is no tenderness. There is no rebound and no guarding.  Musculoskeletal: She exhibits no edema.       Right lower leg: Normal. She exhibits no tenderness and no edema.       Left lower leg: Normal. She exhibits no tenderness and no edema.  Neurological: She is alert and oriented to person, place, and time.  Skin: Skin is warm and dry.  Psychiatric: She has a normal mood and affect.  Nursing note and vitals reviewed.    ED Treatments / Results  Labs (all labs ordered are listed, but only abnormal results are displayed) Labs Reviewed  URINALYSIS, ROUTINE W REFLEX MICROSCOPIC - Abnormal; Notable for the following components:      Result Value   Color, Urine STRAW (*)    Specific Gravity, Urine 1.004 (*)    All other components within normal limits  CBG MONITORING, ED - Abnormal; Notable for the following components:   Glucose-Capillary 59 (*)    All other components within normal limits  CBG MONITORING, ED - Abnormal; Notable for the following components:   Glucose-Capillary 102 (*)    All other components within normal limits  COMPREHENSIVE METABOLIC PANEL  LIPASE, BLOOD  CBC WITH DIFFERENTIAL/PLATELET  I-STAT BETA HCG BLOOD, ED ([facility], [facility], [facility] ONLY)  CBG MONITORING, ED    EKG None  Radiology Dg Chest 2  View  Result Date: 06/13/2018 CLINICAL DATA:  Chest pain, shortness of breath, dizziness EXAM: CHEST - 2 VIEW COMPARISON:  10/19/2009 FINDINGS: Normal heart size, mediastinal contours, and pulmonary vascularity. Lungs clear. No pulmonary infiltrate, pleural effusion or pneumothorax. Bones unremarkable. IMPRESSION: No acute abnormalities. Electronically Signed   By: Ulyses SouthwardMark  Boles M.D.   On: 06/13/2018 16:16    Procedures Procedures (including critical care time)  Medications Ordered in ED Medications - No data to display   Initial Impression / Assessment and Plan / ED  Course  I have reviewed the triage vital signs and the nursing notes.  Pertinent labs & imaging results that were available during my care of the patient were reviewed by me and considered in my medical decision making (see chart for details).  Clinical Course as of Jun 13 1837  Sat Jun 13, 2018  1758 Patient re-evaluated.  She has been given food, however has not eaten much of it in the time she has been here.  Asked patient to eat additional food.  Will re-check sugar.    [EH]    Clinical Course User Index [EH] Cristina Gong, PA-C   Patient is to be discharged with recommendation to follow up with PCP in regards to today's hospital visit. Chest pain is not likely of cardiac or pulmonary etiology d/t presentation, PERC negative, VSS, no tracheal deviation, no JVD or new murmur, RRR, breath sounds equal bilaterally, EKG without acute abnormalities, negative troponin, and negative CXR. Pt has been advised to return to the ED if CP becomes exertional, associated with diaphoresis or nausea, radiates to left jaw/arm, worsens or becomes concerning in any way. Suspect MSK pain vs GERD. Pt appears reliable for follow up and is agreeable to discharge.  Patient was hypoglycemic on arrival, however she had not eaten for 5 hours prior to coming here.  This is a known problem for her, she has previously seen her endocrinologist and  not started on the metformin that they prescribed her.  She was given food and drink and was able to maintain a normal blood sugar while in the department.  Discharged with instructions to eat frequent small meals and follow-up with endocrine.    Final Clinical Impressions(s) / ED Diagnoses   Final diagnoses:  Atypical chest pain  Shortness of breath  Hypoglycemia    ED Discharge Orders    None       Norman Clay 06/13/18 Lyndal Pulley, MD 06/14/18 1601

## 2018-06-13 NOTE — ED Notes (Signed)
Called phlebotomy to request assistance with difficult blood draw.

## 2018-06-13 NOTE — ED Notes (Signed)
Patient transported to X-ray 

## 2018-06-13 NOTE — Discharge Instructions (Signed)
Please make sure that you are taking your Protonix at home.  I suspect that your symptoms are either caused by pain in your chest wall or heartburn.  Please follow-up with your primary care doctor.  If your symptoms worsen, you develop fevers, or have any additional concerns including the pain radiates to your jaw or your left arm please seek additional medical care and evaluation.  Please make sure that you are eating a snack every few hours with complex carbs.  I suspect that you are hypoglycemic today from not eating 5 hours prior to arrival.  Please follow-up with your endocrinologist.

## 2018-06-13 NOTE — ED Notes (Signed)
Provided pt with peanut butter, cracker and OJ to boost blood sugar.  Escorted pt to the restroom.

## 2019-03-22 IMAGING — CR DG CHEST 2V
2 series · 2 of 2 positions shown · non-contrast
Comparison: 10/19/2009

CLINICAL DATA: Chest pain, shortness of breath, dizziness

EXAM:
CHEST - 2 VIEW

[chest pa]
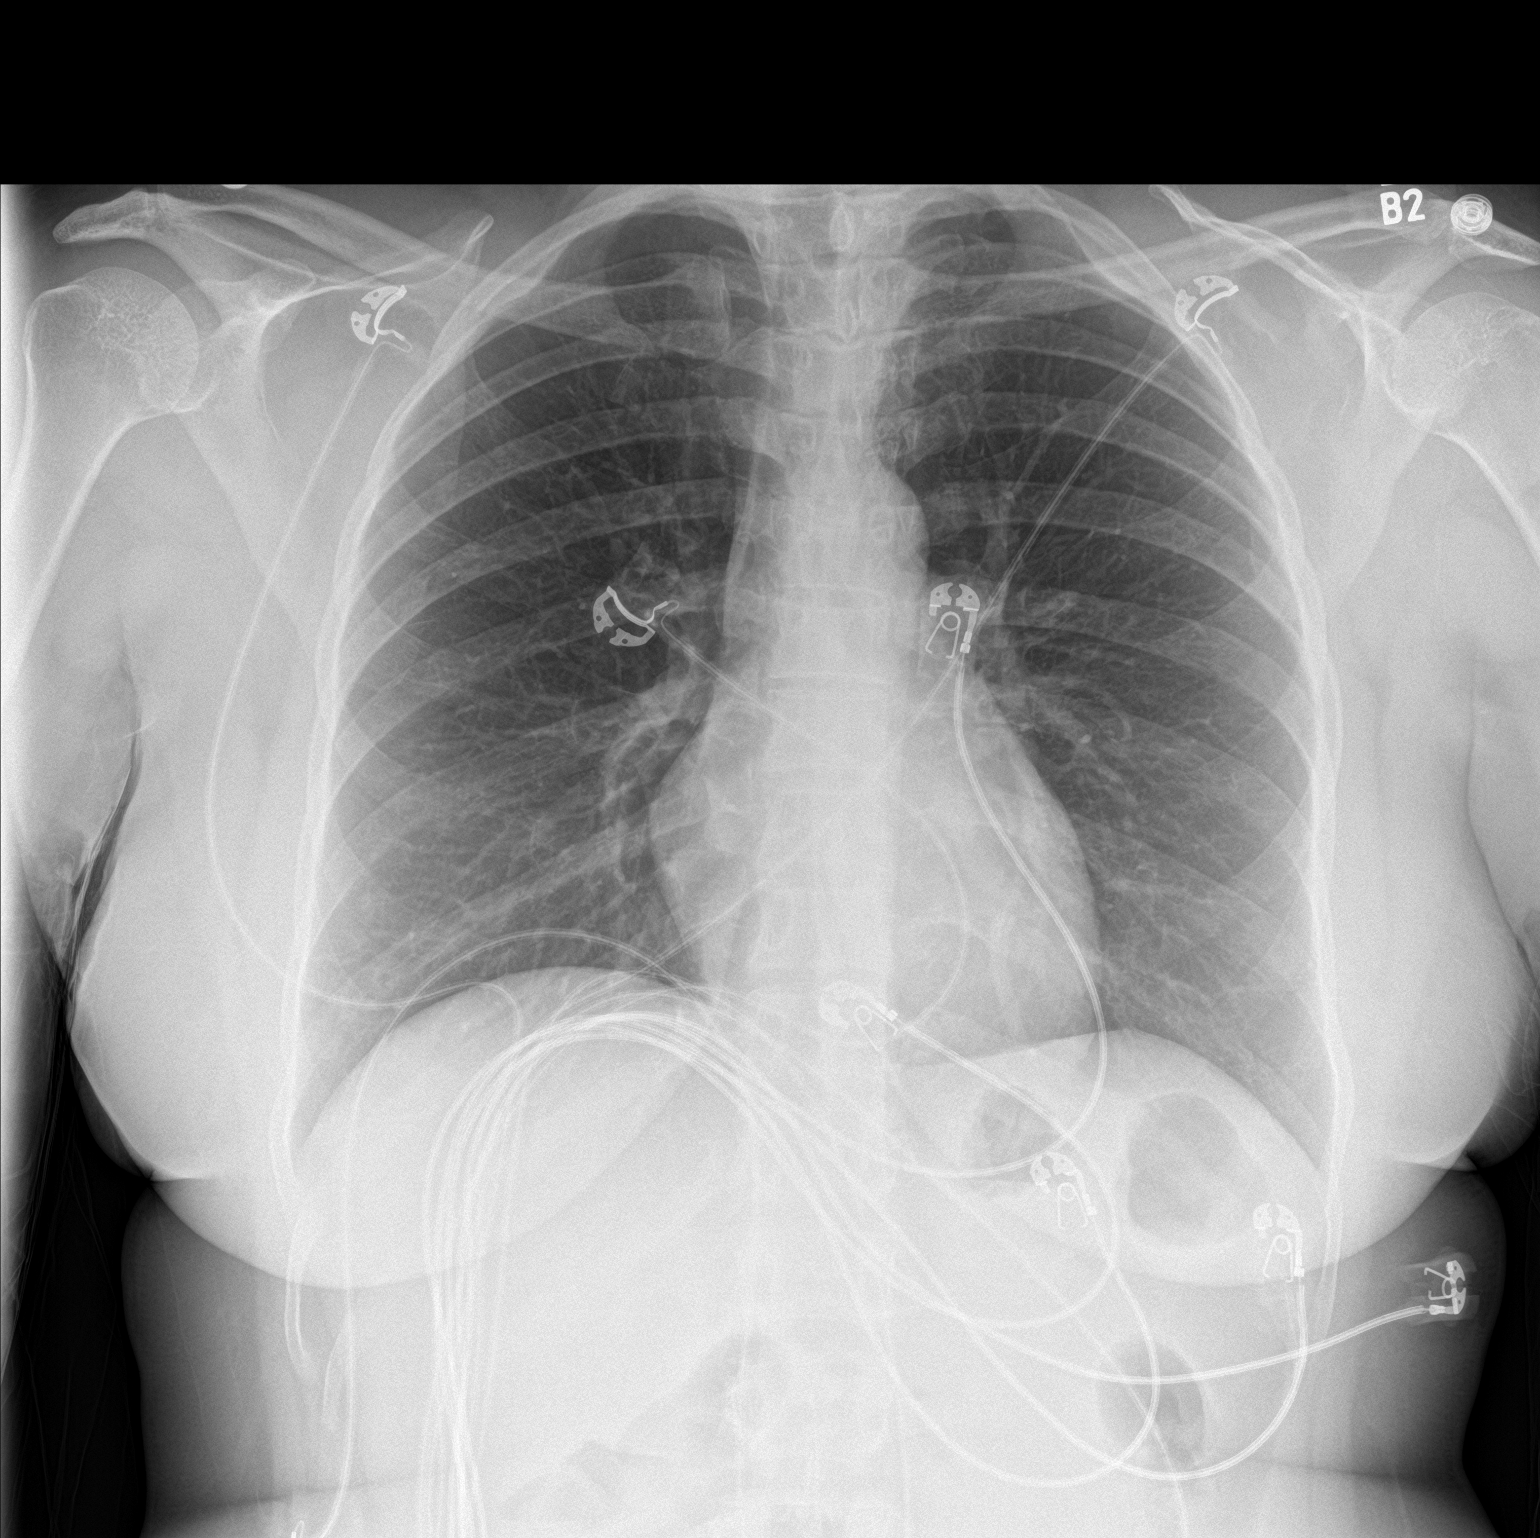

[chest lat]
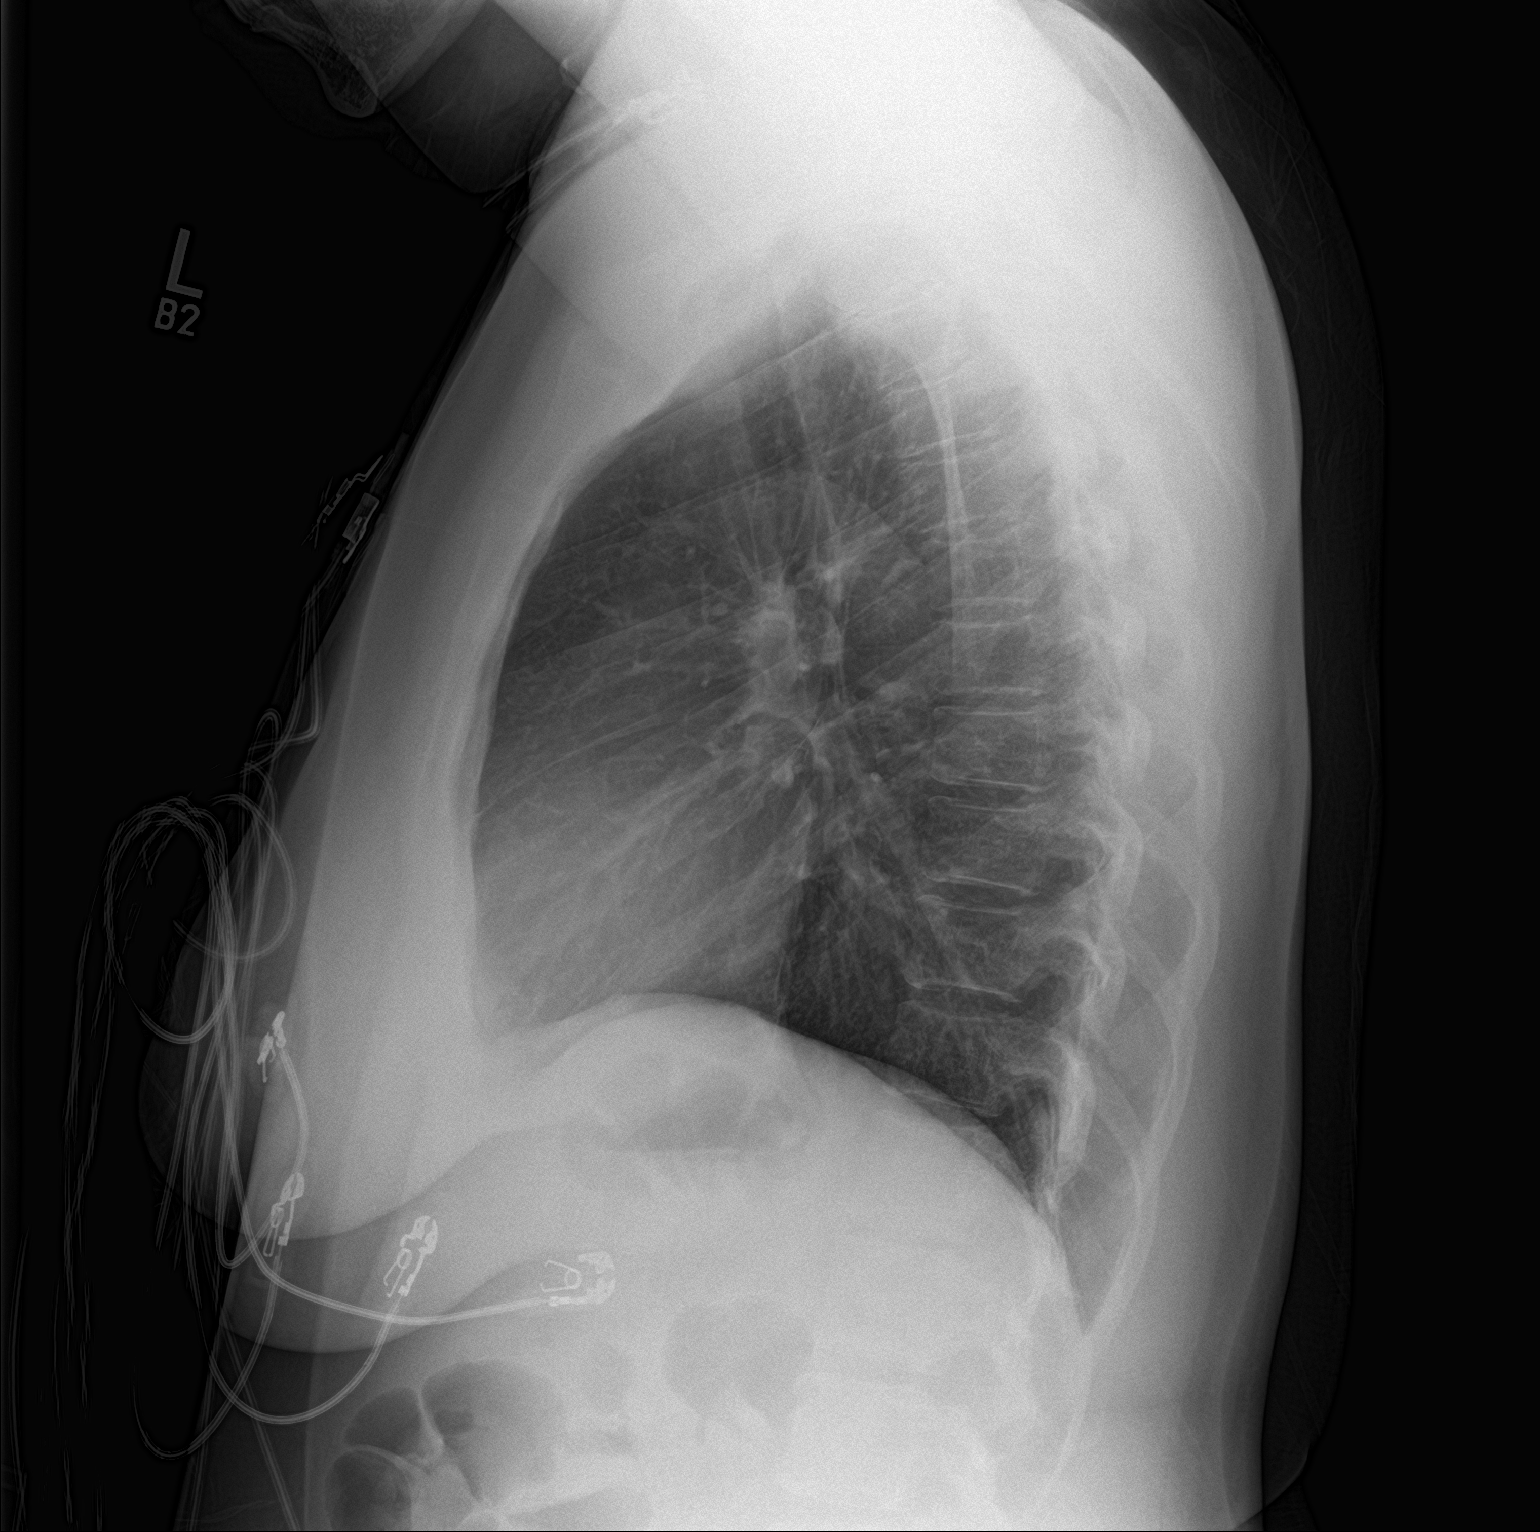

[2 of 2 positions shown; findings below may reference images not displayed]

FINDINGS: Normal heart size, mediastinal contours, and pulmonary vascularity.

Lungs clear.

No pulmonary infiltrate, pleural effusion or pneumothorax.

Bones unremarkable.
IMPRESSION: No acute abnormalities.

## 2022-08-13 ENCOUNTER — Emergency Department (HOSPITAL_COMMUNITY): Payer: Self-pay

## 2022-08-13 ENCOUNTER — Other Ambulatory Visit: Payer: Self-pay

## 2022-08-13 ENCOUNTER — Encounter (HOSPITAL_COMMUNITY): Payer: Self-pay

## 2022-08-13 ENCOUNTER — Emergency Department (HOSPITAL_COMMUNITY)
Admission: EM | Admit: 2022-08-13 | Discharge: 2022-08-14 | Discharge status: Against Medical Advice | Payer: Self-pay | Attending: Student | Admitting: Student

## 2022-08-13 DIAGNOSIS — M79601 Pain in right arm: Secondary | ICD-10-CM | POA: Insufficient documentation

## 2022-08-13 DIAGNOSIS — R42 Dizziness and giddiness: Secondary | ICD-10-CM | POA: Insufficient documentation

## 2022-08-13 DIAGNOSIS — I10 Essential (primary) hypertension: Secondary | ICD-10-CM | POA: Insufficient documentation

## 2022-08-13 DIAGNOSIS — Z5321 Procedure and treatment not carried out due to patient leaving prior to being seen by health care provider: Secondary | ICD-10-CM | POA: Insufficient documentation

## 2022-08-13 DIAGNOSIS — R519 Headache, unspecified: Secondary | ICD-10-CM | POA: Insufficient documentation

## 2022-08-13 DIAGNOSIS — M542 Cervicalgia: Secondary | ICD-10-CM | POA: Insufficient documentation

## 2022-08-13 NOTE — ED Triage Notes (Signed)
Pt arrives with c/o right arm pain that radiates from the base of her head down to her elbow. Per pt, pain started today. Pt endorses nausea. Pt denies fevers.

## 2022-08-13 NOTE — ED Provider Triage Note (Signed)
Emergency Medicine Provider Triage Evaluation Note  Courtney Carey , a 42 y.o. female  was evaluated in triage.  Pt complains of right-sided neck/arm pain, started suddenly about 45 minutes ago, states that she was laying in her bed all of a sudden felt pain in he right arm now she feels pain rating to her right side of her head, she states she is has a slight headache, and feels dizzy which she describes as feeling off balance, she states that her right arm feels heavy but not necessarily weak, she states that her legs will feel newly.  She denies actual weakness, she has no history of stroke, but does note that she is prediabetic as well as being treated for hypertension  Review of Systems  Positive: Right arm pain, right-sided neck pain Negative: Chest pain, shortness of breath  Physical Exam  BP (!) 142/96   Pulse 90   Temp 98.4 F (36.9 C)   Resp 20   Ht 5\' 5"  (1.651 m)   Wt 90.7 kg   SpO2 100%   BMI 33.28 kg/m  Gen:   Awake, no distress   Resp:  Normal effort  MSK:   Moves extremities without difficulty  Other:  Cranial nerves II through XII grossly intact no difficulty with word finding following two-step commands there is no unilateral weakness present.  Gait was fully intact  Medical Decision Making  Medically screening exam initiated at 10:58 PM.  Appropriate orders placed.  Courtney Carey was informed that the remainder of the evaluation will be completed by another provider, this initial triage assessment does not replace that evaluation, and the importance of remaining in the ED until their evaluation is complete.  Spoke with Dr. Doren Custard who also evaluated the patient and agrees not does not meet code stroke, lab work imaging have been ordered will need further workup.   Marcello Fennel, PA-C 08/13/22 2302

## 2022-08-14 ENCOUNTER — Emergency Department (HOSPITAL_COMMUNITY): Payer: Self-pay

## 2022-08-14 LAB — CBC WITH DIFFERENTIAL/PLATELET
Abs Immature Granulocytes: 0.01 10*3/uL (ref 0.00–0.07)
Basophils Absolute: 0 10*3/uL (ref 0.0–0.1)
Basophils Relative: 0 %
Eosinophils Absolute: 0.1 10*3/uL (ref 0.0–0.5)
Eosinophils Relative: 2 %
HCT: 38.6 % (ref 36.0–46.0)
Hemoglobin: 12.6 g/dL (ref 12.0–15.0)
Immature Granulocytes: 0 %
Lymphocytes Relative: 45 %
Lymphs Abs: 2.8 10*3/uL (ref 0.7–4.0)
MCH: 30.5 pg (ref 26.0–34.0)
MCHC: 32.6 g/dL (ref 30.0–36.0)
MCV: 93.5 fL (ref 80.0–100.0)
Monocytes Absolute: 0.3 10*3/uL (ref 0.1–1.0)
Monocytes Relative: 6 %
Neutro Abs: 3 10*3/uL (ref 1.7–7.7)
Neutrophils Relative %: 47 %
Platelets: 165 10*3/uL (ref 150–400)
RBC: 4.13 MIL/uL (ref 3.87–5.11)
RDW: 12.3 % (ref 11.5–15.5)
WBC: 6.2 10*3/uL (ref 4.0–10.5)
nRBC: 0 % (ref 0.0–0.2)

## 2022-08-14 LAB — BASIC METABOLIC PANEL
Anion gap: 9 (ref 5–15)
BUN: 8 mg/dL (ref 6–20)
CO2: 26 mmol/L (ref 22–32)
Calcium: 9.2 mg/dL (ref 8.9–10.3)
Chloride: 103 mmol/L (ref 98–111)
Creatinine, Ser: 0.97 mg/dL (ref 0.44–1.00)
GFR, Estimated: >60 mL/min (ref >60)
Glucose, Bld: 112 mg/dL — ABNORMAL HIGH (ref 70–99)
Potassium: 3.1 mmol/L — ABNORMAL LOW (ref 3.5–5.1)
Sodium: 138 mmol/L (ref 135–145)

## 2022-08-14 LAB — PROTIME-INR
INR: 1 (ref 0.8–1.2)
Prothrombin Time: 12.7 seconds (ref 11.4–15.2)

## 2022-08-14 LAB — TROPONIN I (HIGH SENSITIVITY)
Troponin I (High Sensitivity): <2 ng/L (ref <18)
Troponin I (High Sensitivity): <2 ng/L (ref <18)

## 2022-08-14 NOTE — ED Notes (Signed)
I called for patient 3 more times no answer.

## 2022-08-14 NOTE — ED Notes (Signed)
Called for patient No Answer.

## 2023-07-16 ENCOUNTER — Encounter (HOSPITAL_BASED_OUTPATIENT_CLINIC_OR_DEPARTMENT_OTHER): Payer: Self-pay | Admitting: Emergency Medicine

## 2023-07-16 ENCOUNTER — Emergency Department (HOSPITAL_BASED_OUTPATIENT_CLINIC_OR_DEPARTMENT_OTHER)
Admission: EM | Admit: 2023-07-16 | Discharge: 2023-07-16 | Disposition: A | Discharge status: Home / Self Care | Payer: PRIVATE HEALTH INSURANCE | Attending: Emergency Medicine | Admitting: Emergency Medicine

## 2023-07-16 ENCOUNTER — Other Ambulatory Visit: Payer: Self-pay

## 2023-07-16 DIAGNOSIS — M797 Fibromyalgia: Secondary | ICD-10-CM | POA: Diagnosis not present

## 2023-07-16 DIAGNOSIS — R200 Anesthesia of skin: Secondary | ICD-10-CM | POA: Diagnosis present

## 2023-07-16 LAB — D-DIMER, QUANTITATIVE: D-Dimer, Quant: <0.27 ug{FEU}/mL (ref 0.00–0.50)

## 2023-07-16 MED ORDER — METOCLOPRAMIDE HCL 10 MG PO TABS
10.0000 mg | ORAL_TABLET | Freq: Four times a day (QID) | ORAL | 0 refills | Status: AC
Start: 2023-07-16 — End: ?

## 2023-07-16 MED ORDER — ACETAMINOPHEN 500 MG PO TABS
1000.0000 mg | ORAL_TABLET | Freq: Once | ORAL | Status: AC
  Administered 2023-07-16: 1000 mg via ORAL
  Filled 2023-07-16: qty 2

## 2023-07-16 MED ORDER — DIPHENHYDRAMINE HCL 25 MG PO CAPS
25.0000 mg | ORAL_CAPSULE | Freq: Once | ORAL | Status: AC
  Administered 2023-07-16: 25 mg via ORAL
  Filled 2023-07-16: qty 1

## 2023-07-16 MED ORDER — PROCHLORPERAZINE EDISYLATE 10 MG/2ML IJ SOLN
10.0000 mg | Freq: Once | INTRAMUSCULAR | Status: AC
  Administered 2023-07-16: 10 mg via INTRAVENOUS
  Filled 2023-07-16: qty 2

## 2023-07-16 MED ORDER — KETOROLAC TROMETHAMINE 15 MG/ML IJ SOLN
15.0000 mg | Freq: Once | INTRAMUSCULAR | Status: AC
  Administered 2023-07-16: 15 mg via INTRAVENOUS
  Filled 2023-07-16: qty 1

## 2023-07-16 NOTE — ED Triage Notes (Addendum)
Numbness in left side arm and leg since last week. Increased sensitivity to touch. Xrays performed at [facility] Sunday. 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  On prednisone currently from [facility]. HX fibromyalgia.

## 2023-07-16 NOTE — ED Provider Notes (Signed)
[place] EMERGENCY DEPARTMENT AT [facility] Provider Note   CSN: [id number] Arrival date & time: 07/16/23  2005     History  Chief Complaint  Patient presents with   Numbness    Courtney Carey is a 42 y.o. female.  This is a 42 year old female here today for numbness and increased sensitivity on the left side of her arm and leg and chest that is been ongoing for the last week.  She has a history of fibromyalgia.  She had imaging done at urgent care 1 week ago.  No difficulty walking.        Home Medications Prior to Admission medications   Medication Sig Start Date End Date Taking? Authorizing Provider  metoCLOPramide (REGLAN) 10 MG tablet Take 1 tablet (10 mg total) by mouth every 6 (six) hours. 07/16/23  Yes Anders Simmonds T, DO  buPROPion (WELLBUTRIN XL) 150 MG 24 hr tablet Take 150 mg by mouth daily.    [provider]  pantoprazole (PROTONIX) 20 MG tablet Take 1 tablet (20 mg total) by mouth daily for 21 days. 06/13/18 07/04/18  Cristina Gong, PA-C  phentermine (ADIPEX-P) 37.5 MG tablet Take 18.75 mg by mouth daily before breakfast.    [provider]  topiramate (TOPAMAX) 50 MG tablet Take 25 mg by mouth daily at 12 noon.    [provider]  traMADol (ULTRAM) 50 MG tablet Take 50 mg by mouth every 8 (eight) hours as needed for moderate pain.    [provider]      Allergies    Augmentin [amoxicillin-pot clavulanate], Tape, and Ciprofloxacin    Review of Systems   Review of Systems  Physical Exam Updated Vital Signs BP (!) 143/87   Pulse 60   Temp 98.7 F (37.1 C) (Oral)   Resp 20   Ht 5' 5.5" (1.664 m)   Wt 76.2 kg   SpO2 98%   BMI 27.53 kg/m  Physical Exam Vitals reviewed.  Musculoskeletal:        General: Tenderness present. No swelling or deformity. Normal range of motion.  Neurological:     General: No focal deficit present.     Mental Status: She is alert.     Cranial Nerves: No cranial  nerve deficit.     Sensory: No sensory deficit.     Motor: No weakness.     Gait: Gait normal.     ED Results / Procedures / Treatments   Labs (all labs ordered are listed, but only abnormal results are displayed) Labs Reviewed  D-DIMER, QUANTITATIVE    EKG None  Radiology No results found.  Procedures Procedures    Medications Ordered in ED Medications  prochlorperazine (COMPAZINE) injection 10 mg (10 mg Intravenous Given 07/16/23 2122)  ketorolac (TORADOL) 15 MG/ML injection 15 mg (15 mg Intravenous Given 07/16/23 2129)  acetaminophen (TYLENOL) tablet 1,000 mg (1,000 mg Oral Given 07/16/23 2120)  diphenhydrAMINE (BENADRYL) capsule 25 mg (25 mg Oral Given 07/16/23 2120)    ED Course/ Medical Decision Making/ A&P                                 Medical Decision Making 42 year old female here today with both hypersensitivity and tingling on the left side of her body.  Differential diagnoses include fibromyalgia, chronic pain, DVT, less likely CVA, less likely fracture.  Plan-on physical exam, overall normal appearing skin and musculature.  No obvious deformity,  no rash or lesions.  Lower suspicion for traumatic injury.  Patient has had issues like this intermittently for several years.  She has been formally diagnosed with fibromyalgia.  She stopped taking her SSRI because she was not feeling depressed.  Patient symptoms are not consistent with CVA.  No indication for head CT imaging.  Patient says she felt as though her left leg has been more swollen.  Did have ordered a D-dimer which was negative.  Tried a few medications on the patient which she said did provide some relief in her legs.  Counseled the patient on known management of fibromyalgia which include SSRIs, and low intensity cardiac exercise.  Patient is going to resume taking her fluoxetine and follow-up with her PCP.  Amount and/or Complexity of Data Reviewed Labs: ordered.  Risk OTC drugs. Prescription  drug management.           Final Clinical Impression(s) / ED Diagnoses Final diagnoses:  Fibromyalgia    Rx / DC Orders ED Discharge Orders          Ordered    metoCLOPramide (REGLAN) 10 MG tablet  Every 6 hours        07/16/23 2256              Anders Simmonds T, DO 07/16/23 2256

## 2023-07-16 NOTE — Discharge Instructions (Addendum)
Like we discussed, some of the best treatments for fibromyalgia are the SSRI medications.  Is also recommended to try to do low intensity cardiac workout such as walking.  The theory behind walking is that you increase blood flow to some of these areas on that may help with the pain.  I sent you a prescription for medication called Reglan.  Sometimes this medication can be helpful with people who have fibromyalgia pain.  Please follow-up with your primary care doctor.
